# Patient Record
Sex: Female | Born: 1995 | Race: Black or African American | Hispanic: No | Marital: Single | Smoking: Current every day smoker
Health system: Southern US, Community
[De-identification: ages and names within clinical notes are randomized; demographics above are authoritative.]

---
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
## 2016-04-18 ENCOUNTER — Emergency Department (HOSPITAL_COMMUNITY): Payer: Self-pay

## 2016-04-18 ENCOUNTER — Encounter (HOSPITAL_COMMUNITY): Payer: Self-pay | Admitting: Emergency Medicine

## 2016-04-18 ENCOUNTER — Emergency Department (HOSPITAL_COMMUNITY)
Admission: EM | Admit: 2016-04-18 | Discharge: 2016-04-18 | Disposition: A | Discharge status: Home / Self Care | Payer: Self-pay | Attending: Emergency Medicine | Admitting: Emergency Medicine

## 2016-04-18 DIAGNOSIS — R102 Pelvic and perineal pain: Secondary | ICD-10-CM

## 2016-04-18 DIAGNOSIS — N83519 Torsion of ovary and ovarian pedicle, unspecified side: Secondary | ICD-10-CM

## 2016-04-18 DIAGNOSIS — N946 Dysmenorrhea, unspecified: Secondary | ICD-10-CM | POA: Insufficient documentation

## 2016-04-18 LAB — LIPASE, BLOOD: Lipase: 22 U/L (ref 11–51)

## 2016-04-18 LAB — URINALYSIS, ROUTINE W REFLEX MICROSCOPIC
BILIRUBIN URINE: NEGATIVE
Glucose, UA: NEGATIVE mg/dL
Ketones, ur: 80 mg/dL — AB
Leukocytes, UA: NEGATIVE
NITRITE: NEGATIVE
PH: 5 (ref 5.0–8.0)
Protein, ur: 30 mg/dL — AB
SPECIFIC GRAVITY, URINE: 1.021 (ref 1.005–1.030)

## 2016-04-18 LAB — COMPREHENSIVE METABOLIC PANEL
ALBUMIN: 4.5 g/dL (ref 3.5–5.0)
ALK PHOS: 53 U/L (ref 38–126)
ALT: 15 U/L (ref 14–54)
AST: 21 U/L (ref 15–41)
Anion gap: 7 (ref 5–15)
BILIRUBIN TOTAL: 0.7 mg/dL (ref 0.3–1.2)
BUN: 11 mg/dL (ref 6–20)
CALCIUM: 9.4 mg/dL (ref 8.9–10.3)
CO2: 26 mmol/L (ref 22–32)
Chloride: 104 mmol/L (ref 101–111)
Creatinine, Ser: 0.74 mg/dL (ref 0.44–1.00)
GFR calc Af Amer: >60 mL/min (ref >60)
GFR calc non Af Amer: >60 mL/min (ref >60)
GLUCOSE: 107 mg/dL — AB (ref 65–99)
Potassium: 4.4 mmol/L (ref 3.5–5.1)
Sodium: 137 mmol/L (ref 135–145)
TOTAL PROTEIN: 7.9 g/dL (ref 6.5–8.1)

## 2016-04-18 LAB — CBC
HEMATOCRIT: 41.2 % (ref 36.0–46.0)
Hemoglobin: 13.6 g/dL (ref 12.0–15.0)
MCH: 27.8 pg (ref 26.0–34.0)
MCHC: 33 g/dL (ref 30.0–36.0)
MCV: 84.1 fL (ref 78.0–100.0)
Platelets: 204 10*3/uL (ref 150–400)
RBC: 4.9 MIL/uL (ref 3.87–5.11)
RDW: 13.3 % (ref 11.5–15.5)
WBC: 12.3 10*3/uL — ABNORMAL HIGH (ref 4.0–10.5)

## 2016-04-18 LAB — WET PREP, GENITAL
CLUE CELLS WET PREP: NONE SEEN
SPERM: NONE SEEN
TRICH WET PREP: NONE SEEN
YEAST WET PREP: NONE SEEN

## 2016-04-18 LAB — PREGNANCY, URINE: Preg Test, Ur: NEGATIVE

## 2016-04-18 MED ORDER — NAPROXEN 250 MG PO TABS: 250.0000 mg | ORAL_TABLET | Freq: Two times a day (BID) | ORAL | 0 refills | Status: DC | PRN

## 2016-04-18 MED ORDER — ONDANSETRON HCL 4 MG/2ML IJ SOLN
INTRAMUSCULAR | Status: AC
  Administered 2016-04-18: 4 mg
  Filled 2016-04-18: qty 2

## 2016-04-18 MED ORDER — MORPHINE SULFATE (PF) 2 MG/ML IV SOLN
2.0000 mg | INTRAVENOUS | Status: AC | PRN
  Administered 2016-04-18 (×2): 2 mg via INTRAVENOUS
  Filled 2016-04-18 (×2): qty 1

## 2016-04-18 MED ORDER — KETOROLAC TROMETHAMINE 30 MG/ML IJ SOLN
30.0000 mg | Freq: Once | INTRAMUSCULAR | Status: AC
  Administered 2016-04-18: 30 mg via INTRAVENOUS
  Filled 2016-04-18: qty 1

## 2016-04-18 MED ORDER — IOPAMIDOL (ISOVUE-300) INJECTION 61%
INTRAVENOUS | Status: AC
Start: 2016-04-18 — End: 2016-04-18
  Administered 2016-04-18: 30 mL
  Filled 2016-04-18: qty 30

## 2016-04-18 MED ORDER — IOPAMIDOL (ISOVUE-300) INJECTION 61%
100.0000 mL | Freq: Once | INTRAVENOUS | Status: AC | PRN
  Administered 2016-04-18: 100 mL via INTRAVENOUS

## 2016-04-18 NOTE — Discharge Instructions (Signed)
Take the prescription as directed.  Apply moist heat or ice to the area(s) of discomfort, for 15 minutes at a time, several times per day for the next few days.  Do not fall asleep on a heating or ice pack.  Call your regular OB/GYN doctor tomorrow to schedule a follow up appointment this week.  Return to the Emergency Department immediately if worsening.

## 2016-04-18 NOTE — ED Triage Notes (Signed)
PT c/o lower abdominal pain/cramping with nausea that started this am with cold chills at times per patient. PT states normal BM this am. PT denies any urinary symptoms.

## 2016-04-18 NOTE — ED Notes (Signed)
Complete earror

## 2016-04-18 NOTE — ED Provider Notes (Signed)
[facility]-EMERGENCY DEPT Provider Note   CSN: [id number] Arrival date & time: 04/18/16  1456     History   Chief Complaint Chief Complaint  Patient presents with  . Abdominal Pain    HPI Kim Benson is a 20 y.o. female.  HPI  Pt was seen at 1605.  Per pt, c/o gradual onset and worsening of persistent right sided pelvic "pain" since this morning.  Has been associated with nausea.  Describes the abd pain as "sharp." Pt began her menses yesterday. Denies vomiting/diarrhea, no fevers, no back pain, no rash, no CP/SOB, no black or blood in stools, no dysuria/hematuria, no vaginal discharge.      History reviewed. No pertinent past medical history.  Patient Active Problem List   Diagnosis Date Noted  . Preventative health care 09/21/2011    History reviewed. No pertinent surgical history.  OB History    Gravida Para Term Preterm AB Living   0 0 0 0 0 0   SAB TAB Ectopic Multiple Live Births   0 0 0 0 0       Home Medications    Prior to Admission medications   Medication Sig Start Date End Date Taking? Authorizing Provider  Norethindrone-Ethinyl Estradiol-Fe Biphas (LO LOESTRIN FE) 1 MG-10 MCG / 10 MCG tablet Take 1 tablet by mouth every morning.      Historical Provider, MD    Family History History reviewed. No pertinent family history.  Social History Social History  Substance Use Topics  . Smoking status: Never Smoker  . Smokeless tobacco: Never Used  . Alcohol use No     Allergies   Patient has no known allergies.   Review of Systems Review of Systems ROS: Statement: All systems negative except as marked or noted in the HPI; Constitutional: Negative for fever and chills. ; ; Eyes: Negative for eye pain, redness and discharge. ; ; ENMT: Negative for ear pain, hoarseness, nasal congestion, sinus pressure and sore throat. ; ; Cardiovascular: Negative for chest pain, palpitations, diaphoresis, dyspnea and peripheral edema. ; ; Respiratory: Negative for cough,  wheezing and stridor. ; ; Gastrointestinal: Negative for nausea, vomiting, diarrhea, blood in stool, hematemesis, jaundice and rectal bleeding. . ; ; Genitourinary: Negative for dysuria, flank pain and hematuria. ; ; GYN:  +pelvic pain, +menses, no vaginal discharge, no vulvar pain. ;; Musculoskeletal: Negative for back pain and neck pain. Negative for swelling and trauma.; ; Skin: Negative for pruritus, rash, abrasions, blisters, bruising and skin lesion.; ; Neuro: Negative for headache, lightheadedness and neck stiffness. Negative for weakness, altered level of consciousness, altered mental status, extremity weakness, paresthesias, involuntary movement, seizure and syncope.       Physical Exam Updated Vital Signs BP 117/79 (BP Location: Left Arm)   Pulse 78   Temp 97.6 F (36.4 C) (Oral)   Resp 18   Ht 5\' 7"  (1.702 m)   Wt 215 lb (97.5 kg)   LMP 04/17/2016   SpO2 100%   BMI 33.67 kg/m   Physical Exam 1610: Physical examination:  Nursing notes reviewed; Vital signs and O2 SAT reviewed;  Constitutional: Well developed, Well nourished, Well hydrated, In no acute distress; Head:  Normocephalic, atraumatic; Eyes: EOMI, PERRL, No scleral icterus; ENMT: Mouth and pharynx normal, Mucous membranes moist; Neck: Supple, Full range of motion, No lymphadenopathy; Cardiovascular: Regular rate and rhythm, No gallop; Respiratory: Breath sounds clear & equal bilaterally, No wheezes.  Speaking full sentences with ease, Normal respiratory effort/excursion; Chest: Nontender, Movement normal; Abdomen:  Soft, +right pelvic tenderness to palp. No rebound or guarding. Nondistended, Normal bowel sounds; Genitourinary: No CVA tenderness. Pelvic exam performed with permission of pt and female ED tech assist during exam.  External genitalia w/o lesions. Vaginal vault without discharge, +dark blood.  Cervix w/o lesions, not friable, GC/chlam and wet prep obtained and sent to lab.  Bimanual exam w/o CMT, +uterine and right  adnexal tenderness.;; Extremities: Pulses normal, No tenderness, No edema, No calf edema or asymmetry.; Neuro: AA&Ox3, Major CN grossly intact.  Speech clear. No gross focal motor or sensory deficits in extremities.; Skin: Color normal, Warm, Dry.   ED Treatments / Results  Labs (all labs ordered are listed, but only abnormal results are displayed)   EKG  EKG Interpretation None       Radiology   Procedures Procedures (including critical care time)  Medications Ordered in ED Medications  morphine 2 MG/ML injection 2 mg (not administered)     Initial Impression / Assessment and Plan / ED Course  I have reviewed the triage vital signs and the nursing notes.  Pertinent labs & imaging results that were available during my care of the patient were reviewed by me and considered in my medical decision making (see chart for details).  MDM Reviewed: previous chart, nursing note and vitals Reviewed previous: labs Interpretation: labs and ultrasound    Results for orders placed or performed during the hospital encounter of 04/18/16  Wet prep, genital  Result Value Ref Range   Yeast Wet Prep HPF POC NONE SEEN NONE SEEN   Trich, Wet Prep NONE SEEN NONE SEEN   Clue Cells Wet Prep HPF POC NONE SEEN NONE SEEN   WBC, Wet Prep HPF POC FEW (A) NONE SEEN   Sperm NONE SEEN   Lipase, blood  Result Value Ref Range   Lipase 22 11 - 51 U/L  Comprehensive metabolic panel  Result Value Ref Range   Sodium 137 135 - 145 mmol/L   Potassium 4.4 3.5 - 5.1 mmol/L   Chloride 104 101 - 111 mmol/L   CO2 26 22 - 32 mmol/L   Glucose, Bld 107 (H) 65 - 99 mg/dL   BUN 11 6 - 20 mg/dL   Creatinine, Ser 1.610.74 0.44 - 1.00 mg/dL   Calcium 9.4 8.9 - 09.610.3 mg/dL   Total Protein 7.9 6.5 - 8.1 g/dL   Albumin 4.5 3.5 - 5.0 g/dL   AST 21 15 - 41 U/L   ALT 15 14 - 54 U/L   Alkaline Phosphatase 53 38 - 126 U/L   Total Bilirubin 0.7 0.3 - 1.2 mg/dL   GFR calc non Af Amer >60 >60 mL/min   GFR calc Af Amer  >60 >60 mL/min   Anion gap 7 5 - 15  CBC  Result Value Ref Range   WBC 12.3 (H) 4.0 - 10.5 K/uL   RBC 4.90 3.87 - 5.11 MIL/uL   Hemoglobin 13.6 12.0 - 15.0 g/dL   HCT 04.541.2 40.936.0 - 81.146.0 %   MCV 84.1 78.0 - 100.0 fL   MCH 27.8 26.0 - 34.0 pg   MCHC 33.0 30.0 - 36.0 g/dL   RDW 91.413.3 78.211.5 - 95.615.5 %   Platelets 204 150 - 400 K/uL  Urinalysis, Routine w reflex microscopic  Result Value Ref Range   Color, Urine YELLOW YELLOW   APPearance HAZY (A) CLEAR   Specific Gravity, Urine 1.021 1.005 - 1.030   pH 5.0 5.0 - 8.0   Glucose, UA NEGATIVE NEGATIVE  mg/dL   Hgb urine dipstick LARGE (A) NEGATIVE   Bilirubin Urine NEGATIVE NEGATIVE   Ketones, ur 80 (A) NEGATIVE mg/dL   Protein, ur 30 (A) NEGATIVE mg/dL   Nitrite NEGATIVE NEGATIVE   Leukocytes, UA NEGATIVE NEGATIVE   RBC / HPF TOO NUMEROUS TO COUNT 0 - 5 RBC/hpf   WBC, UA 0-5 0 - 5 WBC/hpf   Bacteria, UA RARE (A) NONE SEEN   Mucous PRESENT   Pregnancy, urine  Result Value Ref Range   Preg Test, Ur NEGATIVE NEGATIVE   Ct Abdomen Pelvis W Contrast Result Date: 04/18/2016 CLINICAL DATA:  Right lower quadrant abdominal pain with cramping and nausea. Vomiting. EXAM: CT ABDOMEN AND PELVIS WITH CONTRAST TECHNIQUE: Multidetector CT imaging of the abdomen and pelvis was performed using the standard protocol following bolus administration of intravenous contrast. CONTRAST:  30mL ISOVUE-300 IOPAMIDOL (ISOVUE-300) INJECTION 61%, 100mL ISOVUE-300 IOPAMIDOL (ISOVUE-300) INJECTION 61% COMPARISON:  Pelvic ultrasound 06/20/2015 FINDINGS: Lower chest: Unremarkable Hepatobiliary: Faint dependent density in the gallbladder probably from sludge. Pancreas: Unremarkable Spleen: Unremarkable Adrenals/Urinary Tract: Unremarkable Stomach/Bowel: Unremarkable.  Appendix normal. Vascular/Lymphatic: Unremarkable Reproductive: Unremarkable Other: Trace amount of free pelvic fluid. Trace fluid in the right lower paracolic gutter. Musculoskeletal: Unremarkable IMPRESSION: 1.  Trace fluid in the pelvis and right paracolic gutter, uncertain etiology. 2. The appendix appears normal. 3. Dependent density in the gallbladder probably from sludge. Electronically Signed   By: Gaylyn RongWalter  Liebkemann M.D.   On: 04/18/2016 20:06   [place] Art/ven Flow Abd Pelv Doppler Result Date: 04/18/2016 CLINICAL DATA:  Right lower quadrant pain for 1 day EXAM: TRANSABDOMINAL AND TRANSVAGINAL ULTRASOUND OF PELVIS DOPPLER ULTRASOUND OF OVARIES TECHNIQUE: Both transabdominal and transvaginal ultrasound examinations of the pelvis were performed. Transabdominal technique was performed for global imaging of the pelvis including uterus, ovaries, adnexal regions, and pelvic cul-de-sac. It was necessary to proceed with endovaginal exam following the transabdominal exam to visualize the ovaries. Color and duplex Doppler ultrasound was utilized to evaluate blood flow to the ovaries. COMPARISON:  None. FINDINGS: Uterus Measurements: 8.0 x 4.1 x 4.2 cm. No fibroids or other mass visualized. Endometrium Thickness: 8 mm.  No focal abnormality visualized. Right ovary Measurements: 3.7 x 2.5 x 3.1 cm. Normal appearance/no adnexal mass. Left ovary Measurements: 3.2 x 2.1 x 2.6 cm. Normal appearance/no adnexal mass. Pulsed Doppler evaluation of both ovaries demonstrates normal low-resistance arterial and venous waveforms. Other findings Mild free fluid is noted within the pelvis. IMPRESSION: Mild free pelvic fluid.  No other focal abnormality is noted. Electronically Signed   By: Alcide CleverMark  Lukens M.D.   On: 04/18/2016 17:53    2120:  Trace FF; question ovarian cyst rupture, given otherwise reassuring workup. Tx symptomatically, f/u OB/GYN. Dx and testing d/w pt and family.  Questions answered.  Verb understanding, agreeable to d/c home with outpt f/u.    Final Clinical Impressions(s) / ED Diagnoses   Final diagnoses:  pelvic pain r/o  Pelvic pain in female  Dysmenorrhea    New Prescriptions New Prescriptions   No  medications on file     Samuel JesterKathleen Forever Arechiga, DO 04/22/16 2050

## 2016-04-20 LAB — GC/CHLAMYDIA PROBE AMP (~~LOC~~) NOT AT ARMC
Chlamydia: NEGATIVE
Neisseria Gonorrhea: NEGATIVE

## 2018-10-03 IMAGING — US US TRANSVAGINAL NON-OB
1 series · 14 of 25 positions shown · non-contrast
Comparison: None.

CLINICAL DATA: Right lower quadrant pain for 1 day

EXAM:
TRANSABDOMINAL AND TRANSVAGINAL ULTRASOUND OF PELVIS
DOPPLER ULTRASOUND OF OVARIES
TECHNIQUE: Both transabdominal and transvaginal ultrasound examinations of the
pelvis were performed. Transabdominal technique was performed for
global imaging of the pelvis including uterus, ovaries, adnexal
regions, and pelvic cul-de-sac.
It was necessary to proceed with endovaginal exam following the
transabdominal exam to visualize the ovaries. Color and duplex
Doppler ultrasound was utilized to evaluate blood flow to the
ovaries.

[Series 1: us transvaginal non-ob · 0.20mm/px · 14 of 98 slices shown]
[im 1/98]
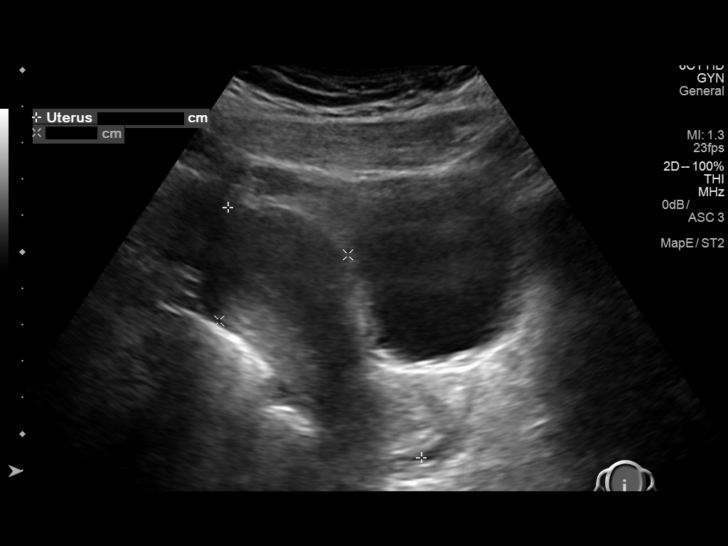
[im 9/98]
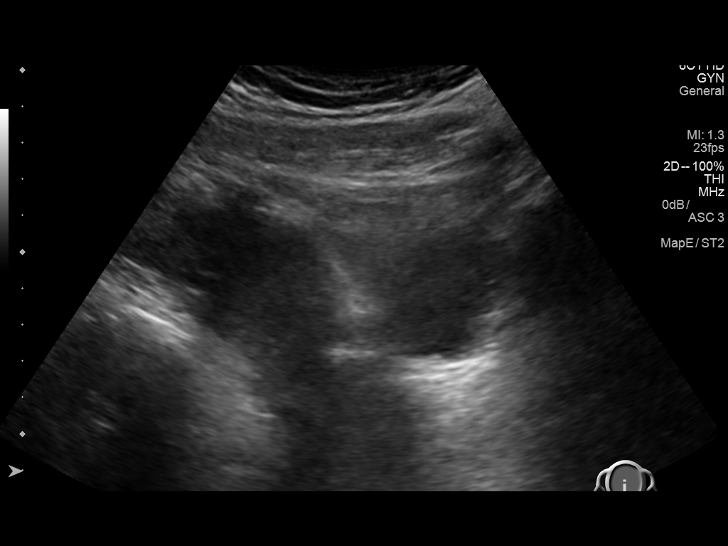
[im 17/98]
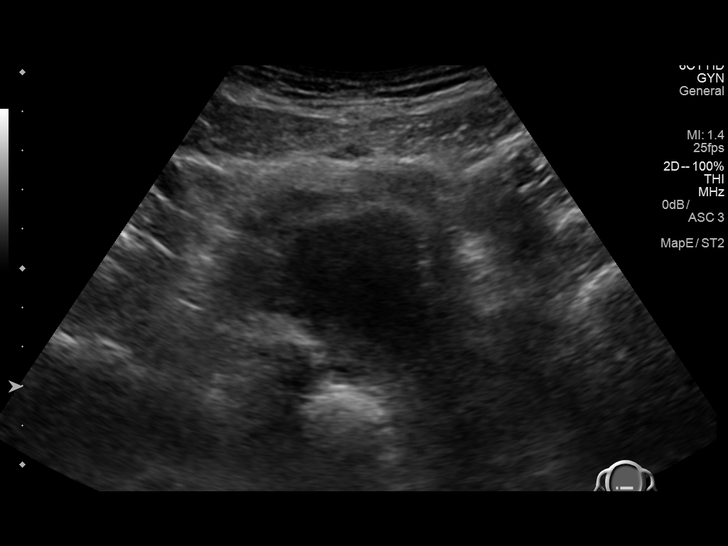
[im 25/98]
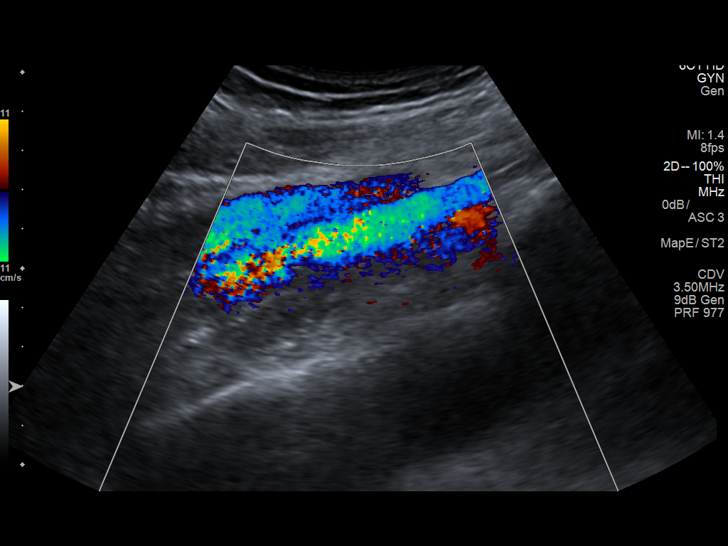
[im 33/98]
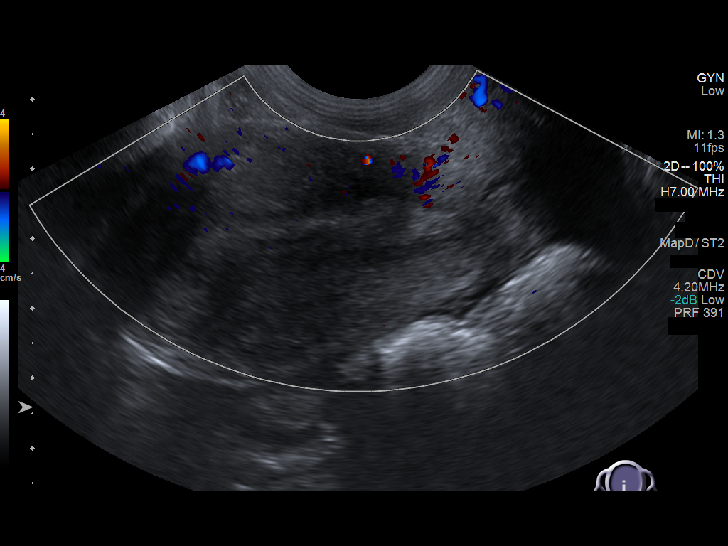
[im 37/98]
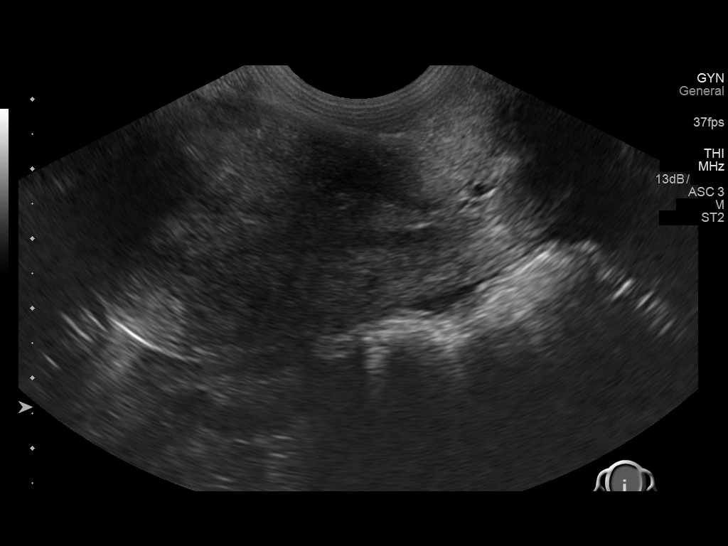
[im 45/98]
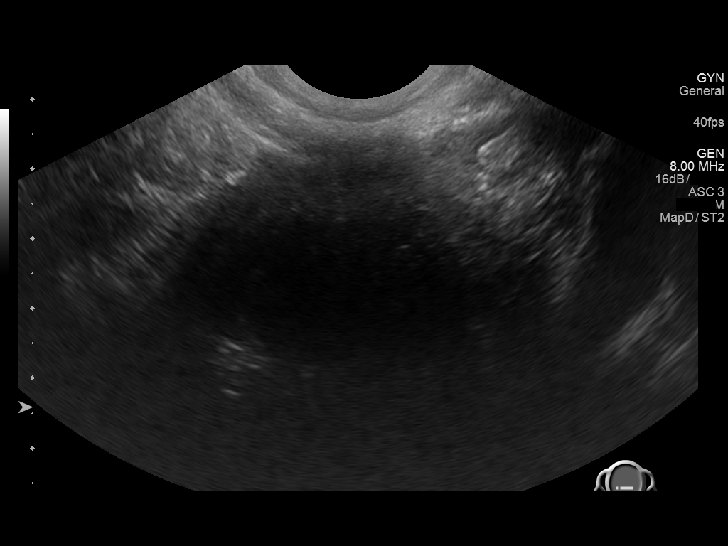
[im 53/98]
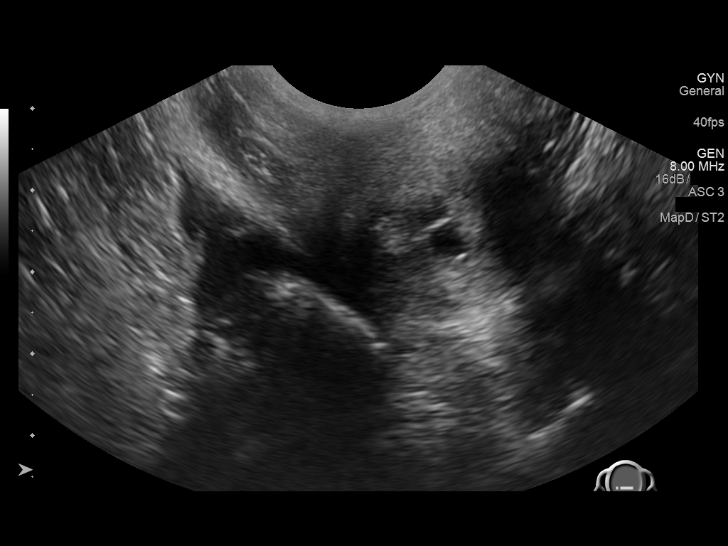
[im 61/98]
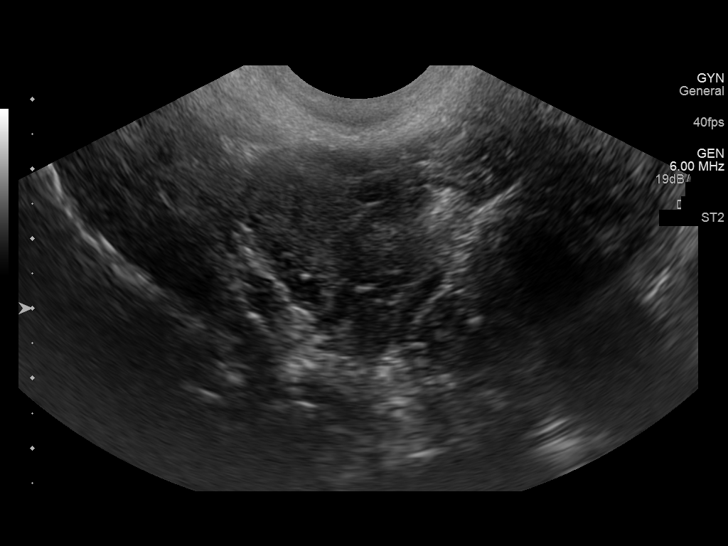
[im 65/98]
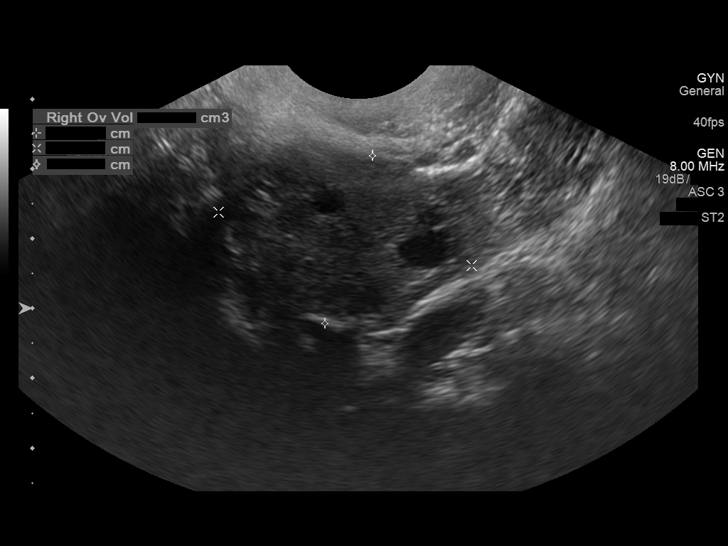
[im 73/98]
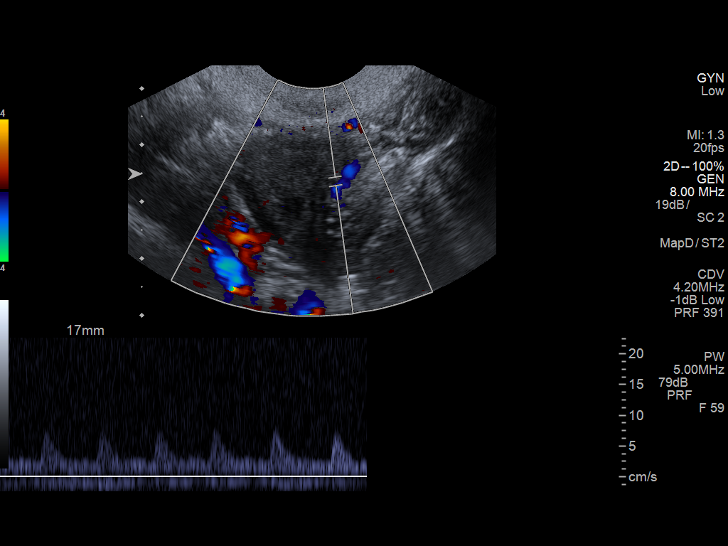
[im 81/98]
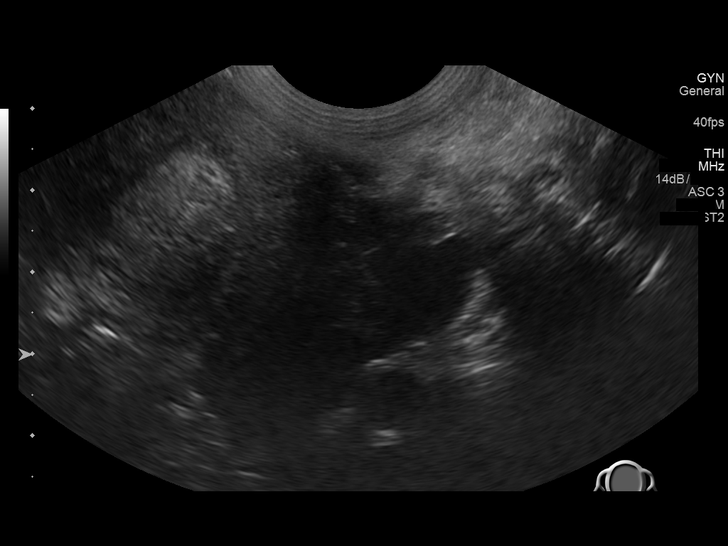
[im 89/98]
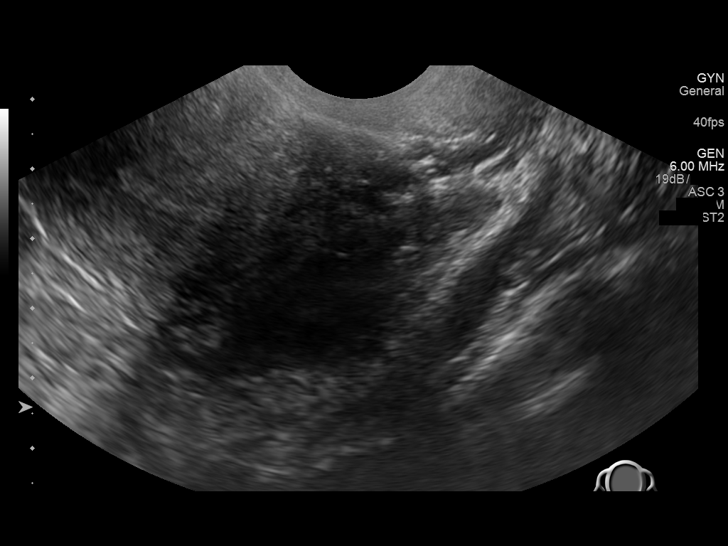
[im 98/98]
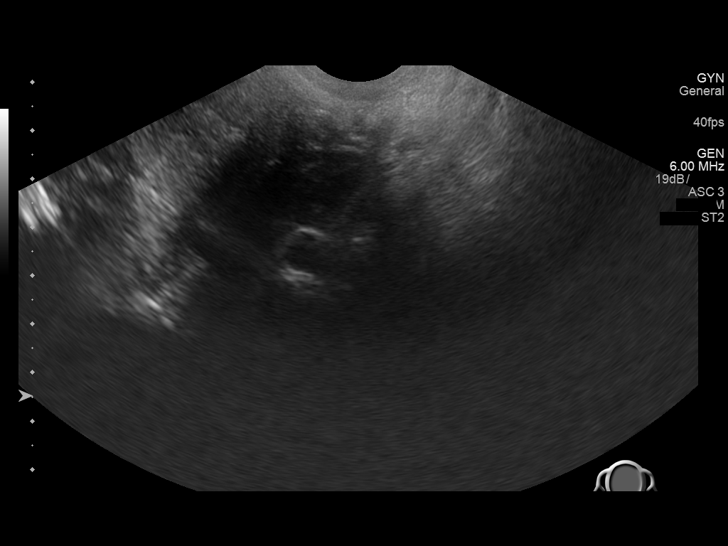

[14 of 25 positions shown; findings below may reference images not displayed]

FINDINGS: Uterus

Measurements: 8.0 x 4.1 x 4.2 cm.. No fibroids or other mass
visualized.

Endometrium

Thickness: 8 mm..  No focal abnormality visualized.

Right ovary

Measurements: 3.7 x 2.5 x 3.1 cm.. Normal appearance/no adnexal
mass.

Left ovary

Measurements: 3.2 x 2.1 x 2.6 cm.. Normal appearance/no adnexal
mass.

Pulsed Doppler evaluation of both ovaries demonstrates normal
low-resistance arterial and venous waveforms.

Other findings

Mild free fluid is noted within the pelvis.
IMPRESSION: Mild free pelvic fluid.  No other focal abnormality is noted.

## 2018-11-23 ENCOUNTER — Emergency Department (HOSPITAL_COMMUNITY)
Admission: EM | Admit: 2018-11-23 | Discharge: 2018-11-23 | Disposition: A | Discharge status: Home / Self Care | Payer: Self-pay | Attending: Emergency Medicine | Admitting: Emergency Medicine

## 2018-11-23 ENCOUNTER — Encounter (HOSPITAL_COMMUNITY): Payer: Self-pay | Admitting: Emergency Medicine

## 2018-11-23 ENCOUNTER — Other Ambulatory Visit: Payer: Self-pay

## 2018-11-23 DIAGNOSIS — R112 Nausea with vomiting, unspecified: Secondary | ICD-10-CM | POA: Insufficient documentation

## 2018-11-23 MED ORDER — ONDANSETRON 8 MG PO TBDP
8.0000 mg | ORAL_TABLET | Freq: Once | ORAL | Status: AC
  Administered 2018-11-23: 8 mg via ORAL
  Filled 2018-11-23: qty 1

## 2018-11-23 NOTE — ED Triage Notes (Signed)
Pt arrives via POV w/complaints of nausea & vomiting x3 that started today. Denies any diarrhea

## 2018-11-23 NOTE — ED Provider Notes (Signed)
[facility] EMERGENCY DEPARTMENT Provider Note   CSN: [id number] Arrival date & time: 11/23/18  2109     History   Chief Complaint Chief Complaint  Patient presents with  . Nausea    HPI Kim Benson is a 23 y.o. female.     The history is provided by the patient.  Emesis Severity:  Mild Timing:  Intermittent Progression:  Improving Chronicity:  New Relieved by:  None tried Worsened by:  Nothing Associated symptoms: no abdominal pain, no cough, no diarrhea, no fever and no URI   Patient reports she has had 3 episodes of nonbloody emesis over the past day.  No abdominal pain or fever.  She has had normal bowel movements.  No diarrhea.  No cough.  No travel.  Unknown cause of vomiting.   PMH-none Patient Active Problem List   Diagnosis Date Noted  . Preventative health care 09/21/2011    History reviewed. No pertinent surgical history.   OB History    Gravida  0   Para  0   Term  0   Preterm  0   AB  0   Living  0     SAB  0   TAB  0   Ectopic  0   Multiple  0   Live Births  0            Home Medications    Prior to Admission medications   Not on File    Family History No family history on file.  Social History Social History   Tobacco Use  . Smoking status: Never Smoker  . Smokeless tobacco: Never Used  Substance Use Topics  . Alcohol use: No  . Drug use: No     Allergies   Patient has no known allergies.   Review of Systems Review of Systems  Constitutional: Negative for fever.  Respiratory: Negative for cough.   Gastrointestinal: Positive for vomiting. Negative for abdominal pain, blood in stool, constipation and diarrhea.  All other systems reviewed and are negative.    Physical Exam Updated Vital Signs BP 103/66 (BP Location: Right Arm)   Pulse 98   Temp 98.5 F (36.9 C) (Oral)   Resp 16   Ht 1.702 m (5\' 7" )   Wt 97.5 kg   SpO2 99%   BMI 33.67 kg/m   Physical Exam CONSTITUTIONAL: Well  developed/well nourished HEAD: Normocephalic/atraumatic EYES: EOMI/PERRL, no icterus ENMT: Mucous membranes moist NECK: supple no meningeal signs SPINE/BACK:entire spine nontender CV: S1/S2 noted, no murmurs/rubs/gallops noted LUNGS: Lungs are clear to auscultation bilaterally, no apparent distress ABDOMEN: soft, nontender, no rebound or guarding, bowel sounds noted throughout abdomen NEURO: Pt is awake/alert/appropriate, moves all extremitiesx4.  No facial droop.   EXTREMITIES:  full ROM SKIN: warm, color normal PSYCH: no abnormalities of mood noted, alert and oriented to situation   ED Treatments / Results  Labs (all labs ordered are listed, but only abnormal results are displayed) Labs Reviewed - No data to display  EKG None  Radiology No results found.  Procedures Procedures Medications Ordered in ED Medications  ondansetron (ZOFRAN-ODT) disintegrating tablet 8 mg (8 mg Oral Given 11/23/18 2323)     Initial Impression / Assessment and Plan / ED Course  I have reviewed the triage vital signs and the nursing notes.     Patient presents for evaluation of 3 episodes of nonbloody emesis.  She is now feeling improved.  She declines all further testing.  She is requesting  a work note.  We discussed strict return precautions  Final Clinical Impressions(s) / ED Diagnoses   Final diagnoses:  Non-intractable vomiting with nausea, unspecified vomiting type    ED Discharge Orders    None       Ripley Fraise, MD 11/23/18 2344

## 2018-11-23 NOTE — ED Notes (Signed)
Pt refused IV and labwork at this time, explained to pt that labs will probably need to be drawn and can be done with IV start.  Pt refused at this time and will wait til EDP sees pt.

## 2019-12-13 ENCOUNTER — Ambulatory Visit: Payer: Self-pay | Attending: Internal Medicine

## 2019-12-13 ENCOUNTER — Other Ambulatory Visit: Payer: Self-pay

## 2019-12-13 DIAGNOSIS — Z20822 Contact with and (suspected) exposure to covid-19: Secondary | ICD-10-CM

## 2019-12-13 DIAGNOSIS — Z23 Encounter for immunization: Secondary | ICD-10-CM

## 2019-12-13 NOTE — Progress Notes (Signed)
   Covid-19 Vaccination Clinic  Name:  KESHARA KIGER    MRN: [id number] DOB: 09-03-1995  12/13/2019  Ms. Lubrano was observed post Covid-19 immunization for 15 minutes without incident. She was provided with Vaccine Information Sheet and instruction to access the V-Safe system.   Ms. Barsky was instructed to call 911 with any severe reactions post vaccine: Marland Kitchen Difficulty breathing  . Swelling of face and throat  . A fast heartbeat  . A bad rash all over body  . Dizziness and weakness   Immunizations Administered    Name Date Dose VIS Date Route   Pfizer COVID-19 Vaccine 12/13/2019 11:52 AM 0.3 mL 07/04/2018 Intramuscular   Manufacturer: [employer], [employer]   Lot: [id number]   NDC: [address]-3

## 2019-12-14 LAB — SARS-COV-2, NAA 2 DAY TAT

## 2019-12-14 LAB — NOVEL CORONAVIRUS, NAA: SARS-CoV-2, NAA: NOT DETECTED

## 2020-01-03 ENCOUNTER — Ambulatory Visit: Payer: Self-pay | Attending: Internal Medicine

## 2020-01-03 DIAGNOSIS — Z23 Encounter for immunization: Secondary | ICD-10-CM

## 2020-01-03 NOTE — Progress Notes (Signed)
   Covid-19 Vaccination Clinic  Name:  Kim Benson    MRN: [id number] DOB: June 09, 1995  01/03/2020  [name] was observed post Covid-19 immunization for 15 minutes without incident. She was provided with Vaccine Information Sheet and instruction to access the V-Safe system.   [name] was instructed to call 911 with any severe reactions post vaccine: Marland Kitchen Difficulty breathing  . Swelling of face and throat  . A fast heartbeat  . A bad rash all over body  . Dizziness and weakness   Immunizations Administered    Name Date Dose VIS Date Route   Pfizer COVID-19 Vaccine 01/03/2020 10:47 AM 0.3 mL 07/04/2018 Intramuscular   Manufacturer: [employer], [employer]   Lot: [id number]   NDC: [address]-4

## 2021-04-30 ENCOUNTER — Emergency Department (HOSPITAL_COMMUNITY)
Admission: EM | Admit: 2021-04-30 | Discharge: 2021-04-30 | Disposition: A | Discharge status: Home / Self Care | Payer: Self-pay | Attending: Emergency Medicine | Admitting: Emergency Medicine

## 2021-04-30 ENCOUNTER — Encounter (HOSPITAL_COMMUNITY): Payer: Self-pay | Admitting: *Deleted

## 2021-04-30 DIAGNOSIS — H9201 Otalgia, right ear: Secondary | ICD-10-CM | POA: Insufficient documentation

## 2021-04-30 MED ORDER — FLUTICASONE PROPIONATE 50 MCG/ACT NA SUSP
2.0000 | Freq: Every day | NASAL | 0 refills | Status: DC
Start: 2021-04-30 — End: 2022-03-16

## 2021-04-30 NOTE — ED Triage Notes (Signed)
Right earache x 1 week

## 2021-04-30 NOTE — ED Provider Notes (Signed)
[facility] EMERGENCY DEPARTMENT Provider Note   CSN: [id number] Arrival date & time: 04/30/21  1247     History Chief Complaint  Patient presents with   Otalgia    Kim Benson is a 25 y.o. female who presents the emergency department with an earache that is been constant since today.  Patient states she was sick with influenza last week and has recovered from those symptoms.  However, she has been having some lingering nasal congestion since her illness last week.  Her ear has been hurting intermittently for the last 5 to 7 days but today it has been constant.  She describes this pain as a stabbing sensation.  She currently rates her earache moderate severity.  Is worse with cold temperatures.  She denies any fever, purulent drainage, sore throat, cough, congestion, hearing loss, tinnitus, and dizziness. She denies any trauma to the ear or Q-tip use.    Otalgia     History reviewed. No pertinent past medical history.  Patient Active Problem List   Diagnosis Date Noted   Preventative health care 09/21/2011    History reviewed. No pertinent surgical history.   OB History     Gravida  0   Para  0   Term  0   Preterm  0   AB  0   Living  0      SAB  0   IAB  0   Ectopic  0   Multiple  0   Live Births  0           No family history on file.  Social History   Tobacco Use   Smoking status: Never   Smokeless tobacco: Never  Vaping Use   Vaping Use: Never used  Substance Use Topics   Alcohol use: No   Drug use: No    Home Medications Prior to Admission medications   Medication Sig Start Date End Date Taking? Authorizing Provider  fluticasone (FLONASE) 50 MCG/ACT nasal spray Place 2 sprays into both nostrils daily. 04/30/21  Yes Honor Loh M, PA-C    Allergies    Patient has no known allergies.  Review of Systems   Review of Systems  HENT:  Positive for ear pain.   All other systems reviewed and are negative.  Physical  Exam Updated Vital Signs BP (!) 120/92 (BP Location: Right Wrist)    Pulse (!) 107    Temp 98.5 F (36.9 C) (Oral)    Resp 16    LMP 04/04/2021    SpO2 96%   Physical Exam Vitals and nursing note reviewed.  Constitutional:      Appearance: Normal appearance.  HENT:     Head: Normocephalic and atraumatic.     Ears:     Comments: No auricular or tragal tenderness bilaterally.  Bilateral mastoid are nontender and normal-appearing.  External auditory canals bilaterally are normal without any signs of erythema, edema, or purulent drainage.  Bilateral TMs are pearly gray with a good cone of light.  No evidence of middle ear effusion, bulging, or significant erythema.  No evidence of TM rupture.    Nose:     Comments: Bilateral turbinates are erythematous and swollen.  There is evidence of congestion. Eyes:     General:        Right eye: No discharge.        Left eye: No discharge.     Conjunctiva/sclera: Conjunctivae normal.  Pulmonary:     Effort: Pulmonary effort  is normal.  Skin:    General: Skin is warm and dry.     Findings: No rash.  Neurological:     General: No focal deficit present.     Mental Status: She is alert.  Psychiatric:        Mood and Affect: Mood normal.        Behavior: Behavior normal.    ED Results / Procedures / Treatments   Labs (all labs ordered are listed, but only abnormal results are displayed) Labs Reviewed - No data to display  EKG None  Radiology No results found.  Procedures Procedures   Medications Ordered in ED Medications - No data to display  ED Course  I have reviewed the triage vital signs and the nursing notes.  Pertinent labs & imaging results that were available during my care of the patient were reviewed by me and considered in my medical decision making (see chart for details).    MDM Rules/Calculators/A&P                          Kim Benson is a 25 y.o. female who presents the emergency department with an earache.   Given the history and physical exam, this is likely eustachian tube dysfunction given her previous viral illness last week.  There is evidence of nasal congestion bilaterally on my exam.  I have a low suspicion at this time for otitis externa and media.  I will prescribe her fluticasone nasal spray and will have her take over-the-counter ibuprofen for pain control.  Strict turn precautions given.  All questions and concerns addressed.  She is safe for discharge.     Final Clinical Impression(s) / ED Diagnoses Final diagnoses:  Earache on right    Rx / DC Orders ED Discharge Orders          Ordered    fluticasone (FLONASE) 50 MCG/ACT nasal spray  Daily        04/30/21 1345             Honor Loh [place], [place] 04/30/21 1355    Maia Plan, MD 05/05/21 [phone]

## 2021-04-30 NOTE — Discharge Instructions (Addendum)
Your ears do not appear infected today.  This is likely eustachian tube dysfunction from your previous influenza infection.  Please use fluticasone nasal spray daily.  You can use 2 sprays in each nostril.  Please take 600 mg of ibuprofen every 6 hours for pain.  Please turn the Emergency Department if you experience worsening symptoms, hearing disturbances, drainage from the ear, fevers, or any other concerns you might have.

## 2021-08-19 ENCOUNTER — Ambulatory Visit
Admission: RE | Admit: 2021-08-19 | Discharge: 2021-08-19 | Disposition: A | Discharge status: Home / Self Care | Payer: Medicaid Other | Source: Ambulatory Visit | Attending: Family Medicine | Admitting: Family Medicine

## 2021-08-19 VITALS — BP 127/84 | HR 105 | Temp 98.2°F | Resp 18

## 2021-08-19 DIAGNOSIS — J209 Acute bronchitis, unspecified: Secondary | ICD-10-CM

## 2021-08-19 MED ORDER — PREDNISONE 20 MG PO TABS: 40.0000 mg | ORAL_TABLET | Freq: Every day | ORAL | 0 refills | Status: AC

## 2021-08-19 MED ORDER — PROMETHAZINE-DM 6.25-15 MG/5ML PO SYRP: 5.0000 mL | ORAL_SOLUTION | Freq: Four times a day (QID) | ORAL | 0 refills | Status: DC | PRN

## 2021-08-19 MED ORDER — ALBUTEROL SULFATE HFA 108 (90 BASE) MCG/ACT IN AERS: 1.0000 | INHALATION_SPRAY | Freq: Four times a day (QID) | RESPIRATORY_TRACT | 0 refills | Status: AC | PRN

## 2021-08-19 NOTE — ED Triage Notes (Signed)
Pt states she started coughing Sunday ? ?Pt states she tried her allergy meds and it made it worse ? ?Pt states that her chest is hurting from coughing ? ?Denies fever ?

## 2021-08-19 NOTE — ED Provider Notes (Signed)
?[facility] ? ? ? ?CSN: [id number] ?Arrival date & time: 08/19/21  1000 ? ? ?  ? ?History   ?Chief Complaint ?Chief Complaint  ?Patient presents with  ? Cough  ?  Entered by patient  ? ? ?HPI ?Kim Benson is a 26 y.o. female.  ? ?Presenting today with 5-day history of progressively worsening runny nose, sinus pressure, hacking cough, and started with wheezing and chest tightness last night.  Denies fever, chills, body aches, chest pain, shortness of breath, abdominal pain, nausea vomiting or diarrhea.  Home COVID test was negative.  Trying allergy medications which she states is making things worse.  Multiple sick contacts recently.  History of seasonal allergies, no known history of asthma or other pulmonary disease. ? ? ?History reviewed. No pertinent past medical history. ? ?Patient Active Problem List  ? Diagnosis Date Noted  ? Preventative health care 09/21/2011  ? ? ?History reviewed. No pertinent surgical history. ? ?OB History   ? ? Gravida  ?0  ? Para  ?0  ? Term  ?0  ? Preterm  ?0  ? AB  ?0  ? Living  ?0  ?  ? ? SAB  ?0  ? IAB  ?0  ? Ectopic  ?0  ? Multiple  ?0  ? Live Births  ?0  ?   ?  ?  ? ? ? ?Home Medications   ? ?Prior to Admission medications   ?Medication Sig Start Date End Date Taking? Authorizing Provider  ?albuterol (VENTOLIN HFA) 108 (90 Base) MCG/ACT inhaler Inhale 1-2 puffs into the lungs every 6 (six) hours as needed for wheezing or shortness of breath. 08/19/21  Yes Particia Nearing, PA-C  ?predniSONE (DELTASONE) 20 MG tablet Take 2 tablets (40 mg total) by mouth daily with breakfast. 08/19/21  Yes Particia Nearing, PA-C  ?promethazine-dextromethorphan (PROMETHAZINE-DM) 6.25-15 MG/5ML syrup Take 5 mLs by mouth 4 (four) times daily as needed. 08/19/21  Yes Particia Nearing, PA-C  ?fluticasone (FLONASE) 50 MCG/ACT nasal spray Place 2 sprays into both nostrils daily. 04/30/21   Teressa Lower, PA-C  ? ? ?Family History ?No family history on file. ? ?Social  History ?Social History  ? ?Tobacco Use  ? Smoking status: Every Day  ?  Types: Cigars  ? Smokeless tobacco: Never  ? Tobacco comments:  ?  Pt states she smokes back and mild cigars  ?Vaping Use  ? Vaping Use: Never used  ?Substance Use Topics  ? Alcohol use: No  ? Drug use: No  ? ? ? ?Allergies   ?Patient has no known allergies. ? ? ?Review of Systems ?Review of Systems ?Per HPI ? ?Physical Exam ?Triage Vital Signs ?ED Triage Vitals  ?Enc Vitals Group  ?   BP 08/19/21 1040 127/84  ?   Pulse Rate 08/19/21 1040 (!) 105  ?   Resp 08/19/21 1040 18  ?   Temp 08/19/21 1040 98.2 ?F (36.8 ?C)  ?   Temp Source 08/19/21 1040 Oral  ?   SpO2 08/19/21 1040 97 %  ?   Weight --   ?   Height --   ?   Head Circumference --   ?   Peak Flow --   ?   Pain Score 08/19/21 1041 8  ?   Pain Loc --   ?   Pain Edu? --   ?   Excl. in GC? --   ? ?No data found. ? ?Updated Vital Signs ?BP  127/84 (BP Location: Right Arm)   Pulse (!) 105   Temp 98.2 ?F (36.8 ?C) (Oral)   Resp 18   LMP 08/03/2021 (Exact Date)   SpO2 97%  ? ?Visual Acuity ?Right Eye Distance:   ?Left Eye Distance:   ?Bilateral Distance:   ? ?Right Eye Near:   ?Left Eye Near:    ?Bilateral Near:    ? ?Physical Exam ?Vitals and nursing note reviewed.  ?Constitutional:   ?   Appearance: Normal appearance.  ?HENT:  ?   Head: Atraumatic.  ?   Right Ear: Tympanic membrane and external ear normal.  ?   Left Ear: Tympanic membrane and external ear normal.  ?   Nose: Rhinorrhea present.  ?   Mouth/Throat:  ?   Mouth: Mucous membranes are moist.  ?   Pharynx: Posterior oropharyngeal erythema present.  ?Eyes:  ?   Extraocular Movements: Extraocular movements intact.  ?   Conjunctiva/sclera: Conjunctivae normal.  ?Cardiovascular:  ?   Rate and Rhythm: Normal rate and regular rhythm.  ?   Heart sounds: Normal heart sounds.  ?Pulmonary:  ?   Effort: Pulmonary effort is normal.  ?   Breath sounds: Normal breath sounds. No wheezing or rales.  ?Musculoskeletal:     ?   General: Normal range  of motion.  ?   Cervical back: Normal range of motion and neck supple.  ?Skin: ?   General: Skin is warm and dry.  ?Neurological:  ?   Mental Status: She is alert and oriented to person, place, and time.  ?Psychiatric:     ?   Mood and Affect: Mood normal.     ?   Thought Content: Thought content normal.  ? ?UC Treatments / Results  ?Labs ?(all labs ordered are listed, but only abnormal results are displayed) ?Labs Reviewed - No data to display ? ?EKG ? ? ?Radiology ?No results found. ? ?Procedures ?Procedures (including critical care time) ? ?Medications Ordered in UC ?Medications - No data to display ? ?Initial Impression / Assessment and Plan / UC Course  ?I have reviewed the triage vital signs and the nursing notes. ? ?Pertinent labs & imaging results that were available during my care of the patient were reviewed by me and considered in my medical decision making (see chart for details). ? ?  ? ?Minimally tachycardic in triage, otherwise vital signs reassuring.  Suspect viral upper respiratory infection progressing into bronchitis.  Treat with prednisone, Phenergan DM, albuterol inhaler as needed.  Discussed supportive over-the-counter medications and home care additionally.  Return for acutely worsening symptoms.  Work note given ? ?Final Clinical Impressions(s) / UC Diagnoses  ? ?Final diagnoses:  ?Acute bronchitis, unspecified organism  ? ?Discharge Instructions   ?None ?  ? ?ED Prescriptions   ? ? Medication Sig Dispense Auth. Provider  ? predniSONE (DELTASONE) 20 MG tablet Take 2 tablets (40 mg total) by mouth daily with breakfast. 10 tablet Particia Nearing, PA-C  ? albuterol (VENTOLIN HFA) 108 (90 Base) MCG/ACT inhaler Inhale 1-2 puffs into the lungs every 6 (six) hours as needed for wheezing or shortness of breath. 18 g Particia Nearing, [place]  ? promethazine-dextromethorphan (PROMETHAZINE-DM) 6.25-15 MG/5ML syrup Take 5 mLs by mouth 4 (four) times daily as needed. 100 mL Particia Nearing, [place]  ? ?  ? ?PDMP not reviewed this encounter. ?  ?Particia Nearing, PA-C ?08/19/21 1114 ? ?

## 2022-03-16 ENCOUNTER — Ambulatory Visit
Admission: EM | Admit: 2022-03-16 | Discharge: 2022-03-16 | Disposition: A | Discharge status: Home / Self Care | Payer: Self-pay | Attending: Nurse Practitioner | Admitting: Nurse Practitioner

## 2022-03-16 DIAGNOSIS — R0989 Other specified symptoms and signs involving the circulatory and respiratory systems: Secondary | ICD-10-CM | POA: Insufficient documentation

## 2022-03-16 DIAGNOSIS — Z1152 Encounter for screening for COVID-19: Secondary | ICD-10-CM | POA: Insufficient documentation

## 2022-03-16 DIAGNOSIS — R059 Cough, unspecified: Secondary | ICD-10-CM | POA: Insufficient documentation

## 2022-03-16 MED ORDER — PROMETHAZINE-DM 6.25-15 MG/5ML PO SYRP: 5.0000 mL | ORAL_SOLUTION | Freq: Four times a day (QID) | ORAL | 0 refills | Status: AC | PRN

## 2022-03-16 MED ORDER — FLUTICASONE PROPIONATE 50 MCG/ACT NA SUSP: 2.0000 | Freq: Every day | NASAL | 0 refills | Status: AC

## 2022-03-16 NOTE — ED Provider Notes (Signed)
[facility]    CSN: [id number] Arrival date & time: 03/16/22  1146      History   Chief Complaint Chief Complaint  Patient presents with   Nasal Congestion        Covid Exposure    HPI Kim Benson is a 26 y.o. female.   The history is provided by the patient.   Patient presents with a 2-day history of cough, nasal congestion, and chest congestion.  Patient reports that she had a recent COVID exposure.  She denies fever, chills, chest pain, shortness of breath, difficulty breathing, or GI symptoms.  She states that she received 2 COVID vaccines.  She reports that she has not taken any medication for her symptoms.  She has taken 2 home COVID test, both were negative.  History reviewed. No pertinent past medical history.  Patient Active Problem List   Diagnosis Date Noted   Preventative health care 09/21/2011    History reviewed. No pertinent surgical history.  OB History     Gravida  0   Para  0   Term  0   Preterm  0   AB  0   Living  0      SAB  0   IAB  0   Ectopic  0   Multiple  0   Live Births  0            Home Medications    Prior to Admission medications   Medication Sig Start Date End Date Taking? Authorizing Provider  fluticasone (FLONASE) 50 MCG/ACT nasal spray Place 2 sprays into both nostrils daily. 03/16/22  Yes Nicklas Mcsweeney-Warren, Sadie Haber, NP  promethazine-dextromethorphan (PROMETHAZINE-DM) 6.25-15 MG/5ML syrup Take 5 mLs by mouth 4 (four) times daily as needed for cough. 03/16/22  Yes Liliauna Santoni-Warren, Sadie Haber, NP  albuterol (VENTOLIN HFA) 108 (90 Base) MCG/ACT inhaler Inhale 1-2 puffs into the lungs every 6 (six) hours as needed for wheezing or shortness of breath. 08/19/21   Particia Nearing, PA-C  predniSONE (DELTASONE) 20 MG tablet Take 2 tablets (40 mg total) by mouth daily with breakfast. 08/19/21   Particia Nearing, PA-C    Family History History reviewed. No pertinent family history.  Social  History Social History   Tobacco Use   Smoking status: Every Day    Types: Cigars   Smokeless tobacco: Never   Tobacco comments:    Pt states she smokes back and mild cigars  Vaping Use   Vaping Use: Never used  Substance Use Topics   Alcohol use: No   Drug use: No     Allergies   Patient has no known allergies.   Review of Systems Review of Systems Per HPI  Physical Exam Triage Vital Signs ED Triage Vitals  Enc Vitals Group     BP 03/16/22 1200 130/89     Pulse Rate 03/16/22 1200 93     Resp 03/16/22 1200 18     Temp 03/16/22 1200 99.4 F (37.4 C)     Temp Source 03/16/22 1200 Oral     SpO2 03/16/22 1200 97 %     Weight --      Height --      Head Circumference --      Peak Flow --      Pain Score 03/16/22 1203 0     Pain Loc --      Pain Edu? --      Excl. in GC? --  No data found.  Updated Vital Signs BP 130/89 (BP Location: Right Wrist)   Pulse 93   Temp 99.4 F (37.4 C) (Oral)   Resp 18   LMP  (Within Days) Comment: 20 days  SpO2 97%   Visual Acuity Right Eye Distance:   Left Eye Distance:   Bilateral Distance:    Right Eye Near:   Left Eye Near:    Bilateral Near:     Physical Exam Vitals and nursing note reviewed.  Constitutional:      General: She is not in acute distress.    Appearance: Normal appearance.  HENT:     Head: Normocephalic.     Right Ear: Tympanic membrane, ear canal and external ear normal.     Left Ear: Tympanic membrane, ear canal and external ear normal.     Nose: Congestion and rhinorrhea present.     Right Turbinates: Enlarged and swollen.     Left Turbinates: Enlarged and swollen.     Right Sinus: No maxillary sinus tenderness or frontal sinus tenderness.     Left Sinus: No maxillary sinus tenderness or frontal sinus tenderness.     Mouth/Throat:     Lips: Pink.     Mouth: Mucous membranes are moist.     Pharynx: Posterior oropharyngeal erythema present. No pharyngeal swelling, oropharyngeal exudate or  uvula swelling.  Eyes:     Extraocular Movements: Extraocular movements intact.     Conjunctiva/sclera: Conjunctivae normal.     Pupils: Pupils are equal, round, and reactive to light.  Cardiovascular:     Rate and Rhythm: Normal rate and regular rhythm.     Pulses: Normal pulses.     Heart sounds: Normal heart sounds.  Pulmonary:     Effort: Pulmonary effort is normal. No respiratory distress.     Breath sounds: Normal breath sounds. No stridor. No wheezing, rhonchi or rales.  Abdominal:     General: Bowel sounds are normal.     Palpations: Abdomen is soft.     Tenderness: There is no abdominal tenderness.  Musculoskeletal:     Cervical back: Normal range of motion.  Lymphadenopathy:     Cervical: No cervical adenopathy.  Skin:    General: Skin is warm and dry.  Neurological:     General: No focal deficit present.     Mental Status: She is alert and oriented to person, place, and time.  Psychiatric:        Mood and Affect: Mood normal.        Behavior: Behavior normal.      UC Treatments / Results  Labs (all labs ordered are listed, but only abnormal results are displayed) Labs Reviewed  SARS CORONAVIRUS 2 (TAT 6-24 HRS)    EKG   Radiology No results found.  Procedures Procedures (including critical care time)  Medications Ordered in UC Medications - No data to display  Initial Impression / Assessment and Plan / UC Course  I have reviewed the triage vital signs and the nursing notes.  Pertinent labs & imaging results that were available during my care of the patient were reviewed by me and considered in my medical decision making (see chart for details).  Patient presents with a 2-day history of cough and nasal congestion after a COVID exposure.  Patient's vital signs are stable, she is well-appearing, and is in no acute distress.  COVID test is pending at this time.  Symptoms likely of viral etiology, even if COVID test is positive.  Patient is requesting an  antiviral therapy if her COVID test is positive.  Patient is a candidate to receive molnupiravir.  Patient was prescribed fluticasone 50 mcg nasal spray for her nasal congestion and Promethazine DM for her cough.  Supportive care recommendations were provided to the patient.  Strict ER and return precautions were provided to the patient.  Patient verbalizes understanding.  All questions were answered.  Patient is stable for discharge. Work note was provided.  Final Clinical Impressions(s) / UC Diagnoses   Final diagnoses:  Symptoms of upper respiratory infection (URI)  Cough, unspecified type  Encounter for screening for COVID-19     Discharge Instructions      A COVID test is pending.  As discussed, if the results are positive, you will be contacted. You are a candidate to receive molnipiravir if your results are positive. Take medication as prescribed. Increase fluids and allow for plenty of rest. Recommend Tylenol or ibuprofen as needed for pain, fever, or general discomfort. Warm salt water gargles 3-4 times daily to help with throat pain or discomfort. Recommend using a humidifier at bedtime during sleep to help with cough and nasal congestion. Sleep elevated on 2 pillows. Go to the emergency department immediately if you develop worsening shortness of breath, difficulty breathing, or other concerns. Follow-up as needed.     ED Prescriptions     Medication Sig Dispense Auth. Provider   promethazine-dextromethorphan (PROMETHAZINE-DM) 6.25-15 MG/5ML syrup Take 5 mLs by mouth 4 (four) times daily as needed for cough. 118 mL Deontaye Civello-Warren, Alda Lea, NP   fluticasone (FLONASE) 50 MCG/ACT nasal spray Place 2 sprays into both nostrils daily. 16 g Jenene Kauffmann-Warren, Alda Lea, NP      PDMP not reviewed this encounter.   Tish Men, NP 03/16/22 1232

## 2022-03-16 NOTE — ED Triage Notes (Signed)
Pt reports nasal congestion and chest congestion x 2 days; COVID exposure on 03/14/22. Pt has not taken any meds for complaints.

## 2022-03-16 NOTE — Discharge Instructions (Addendum)
A COVID test is pending.  As discussed, if the results are positive, you will be contacted. You are a candidate to receive molnipiravir if your results are positive. Take medication as prescribed. Increase fluids and allow for plenty of rest. Recommend Tylenol or ibuprofen as needed for pain, fever, or general discomfort. Warm salt water gargles 3-4 times daily to help with throat pain or discomfort. Recommend using a humidifier at bedtime during sleep to help with cough and nasal congestion. Sleep elevated on 2 pillows. Go to the emergency department immediately if you develop worsening shortness of breath, difficulty breathing, or other concerns. Follow-up as needed.

## 2022-03-17 ENCOUNTER — Telehealth (HOSPITAL_COMMUNITY): Payer: Self-pay | Admitting: Emergency Medicine

## 2022-03-17 LAB — SARS CORONAVIRUS 2 (TAT 6-24 HRS): SARS Coronavirus 2: POSITIVE — AB

## 2022-03-17 MED ORDER — MOLNUPIRAVIR EUA 200MG CAPSULE: 4.0000 | ORAL_CAPSULE | Freq: Two times a day (BID) | ORAL | 0 refills | Status: AC

## 2022-04-10 DIAGNOSIS — F17290 Nicotine dependence, other tobacco product, uncomplicated: Secondary | ICD-10-CM | POA: Diagnosis not present

## 2022-04-10 DIAGNOSIS — S161XXA Strain of muscle, fascia and tendon at neck level, initial encounter: Secondary | ICD-10-CM | POA: Diagnosis not present

## 2022-06-09 ENCOUNTER — Telehealth: Payer: Self-pay

## 2022-06-09 NOTE — Telephone Encounter (Signed)
Sending mychart msg. AS, CMA 

## 2023-10-17 DIAGNOSIS — R0789 Other chest pain: Secondary | ICD-10-CM | POA: Diagnosis not present

## 2023-10-17 DIAGNOSIS — R519 Headache, unspecified: Secondary | ICD-10-CM | POA: Diagnosis not present

## 2023-11-28 DIAGNOSIS — R200 Anesthesia of skin: Secondary | ICD-10-CM | POA: Diagnosis not present

## 2023-11-28 DIAGNOSIS — M79645 Pain in left finger(s): Secondary | ICD-10-CM | POA: Diagnosis not present

## 2023-11-28 DIAGNOSIS — S6992XA Unspecified injury of left wrist, hand and finger(s), initial encounter: Secondary | ICD-10-CM | POA: Diagnosis not present

## 2023-11-28 DIAGNOSIS — S63502A Unspecified sprain of left wrist, initial encounter: Secondary | ICD-10-CM | POA: Diagnosis not present

## 2023-12-07 DIAGNOSIS — M25532 Pain in left wrist: Secondary | ICD-10-CM | POA: Diagnosis not present
